# Patient Record
Sex: Male | Born: 1995 | Race: Black or African American | Hispanic: No | Marital: Single | Smoking: Never smoker
Health system: Southern US, Community
[De-identification: ages and names within clinical notes are randomized; demographics above are authoritative.]

## PROBLEM LIST (undated history)

## (undated) DIAGNOSIS — J45909 Unspecified asthma, uncomplicated: Secondary | ICD-10-CM

---
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
## 2013-04-03 IMAGING — CR DG KNEE COMPLETE 4+V*L*
4 series · 4 of 4 positions shown · non-contrast
Comparison: None.

CLINICAL DATA: Injury with pain

LEFT KNEE - COMPLETE 4+ VIEW

[view not recorded (1 of 4)]
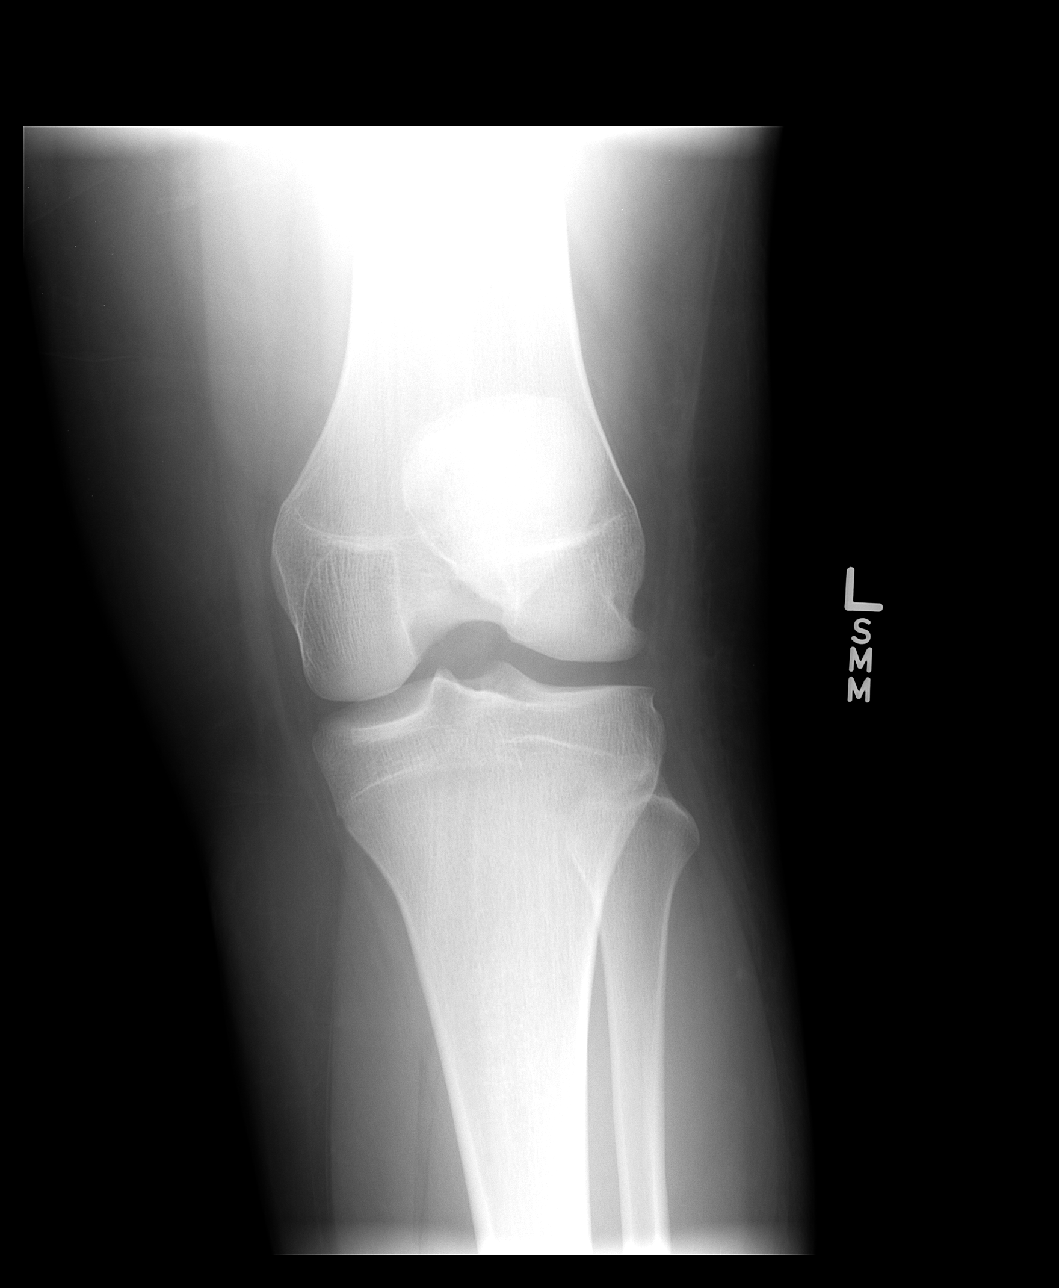

[view not recorded (2 of 4)]
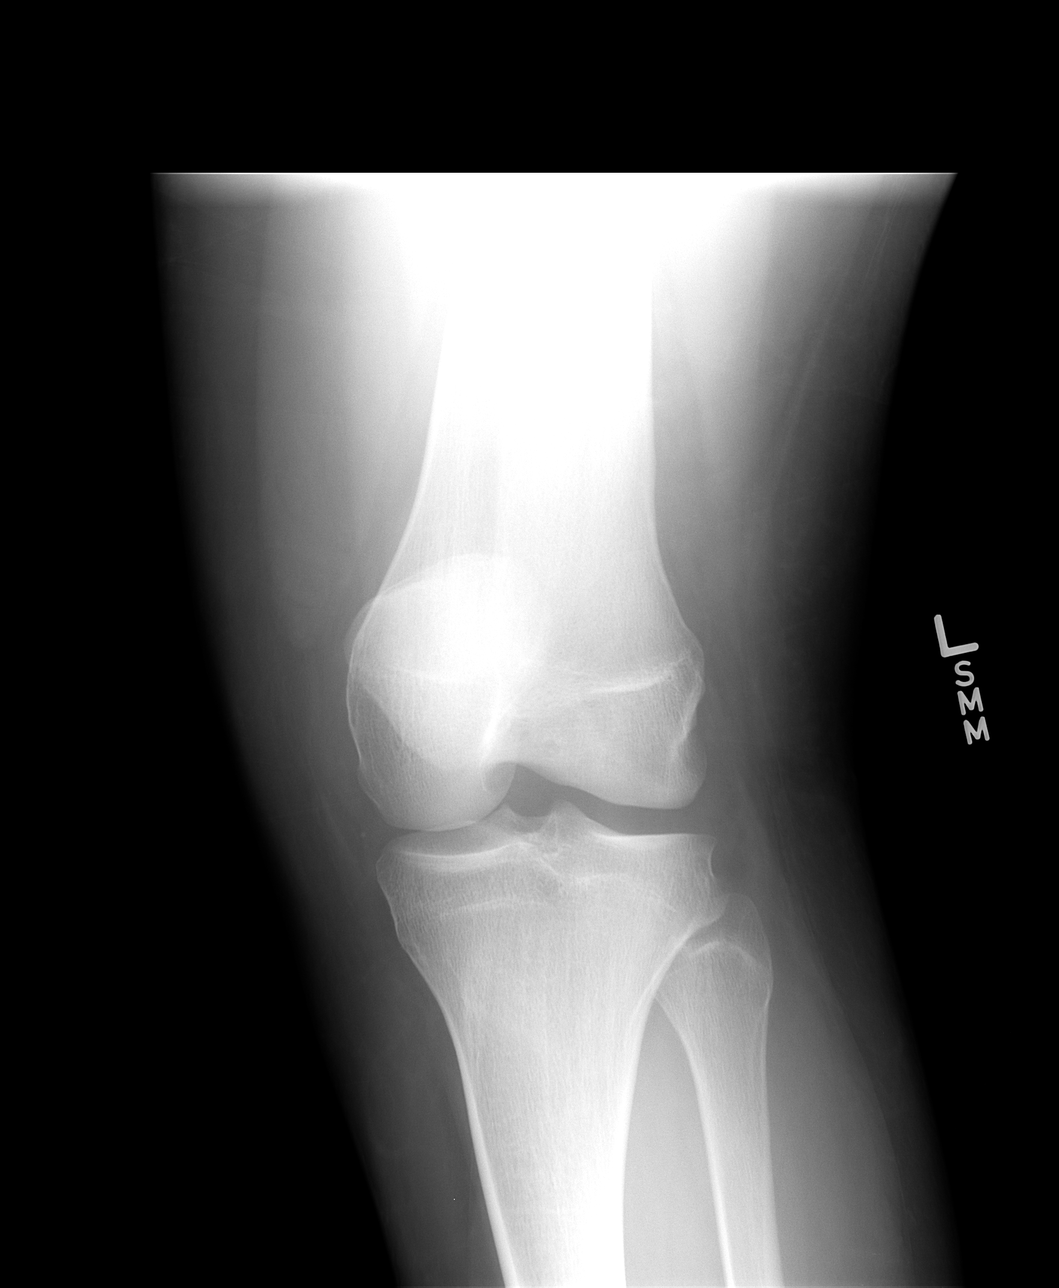

[view not recorded (3 of 4)]
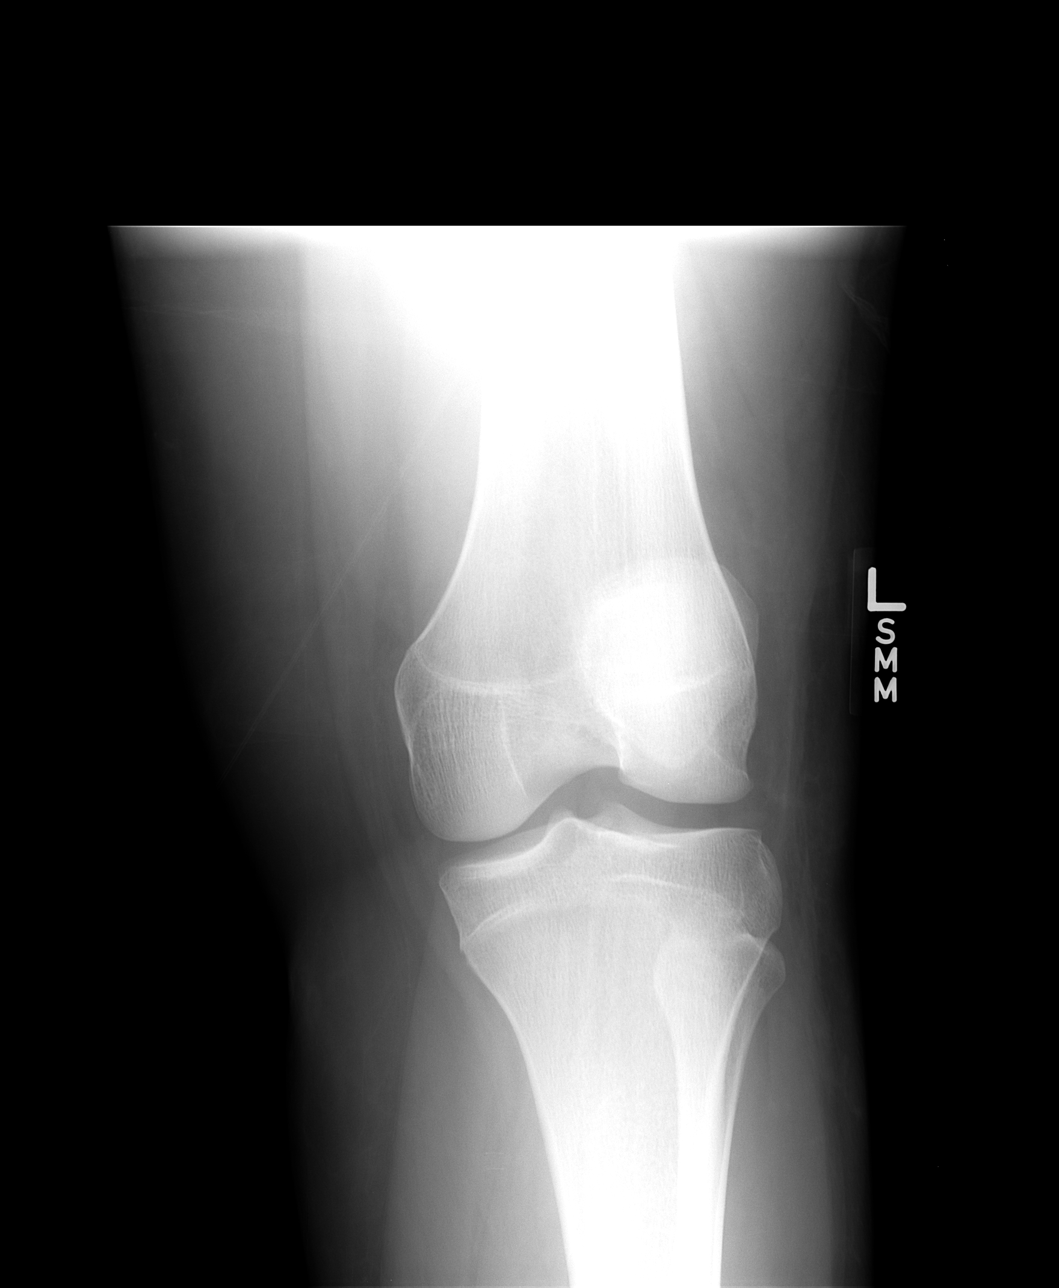

[view not recorded (4 of 4)]
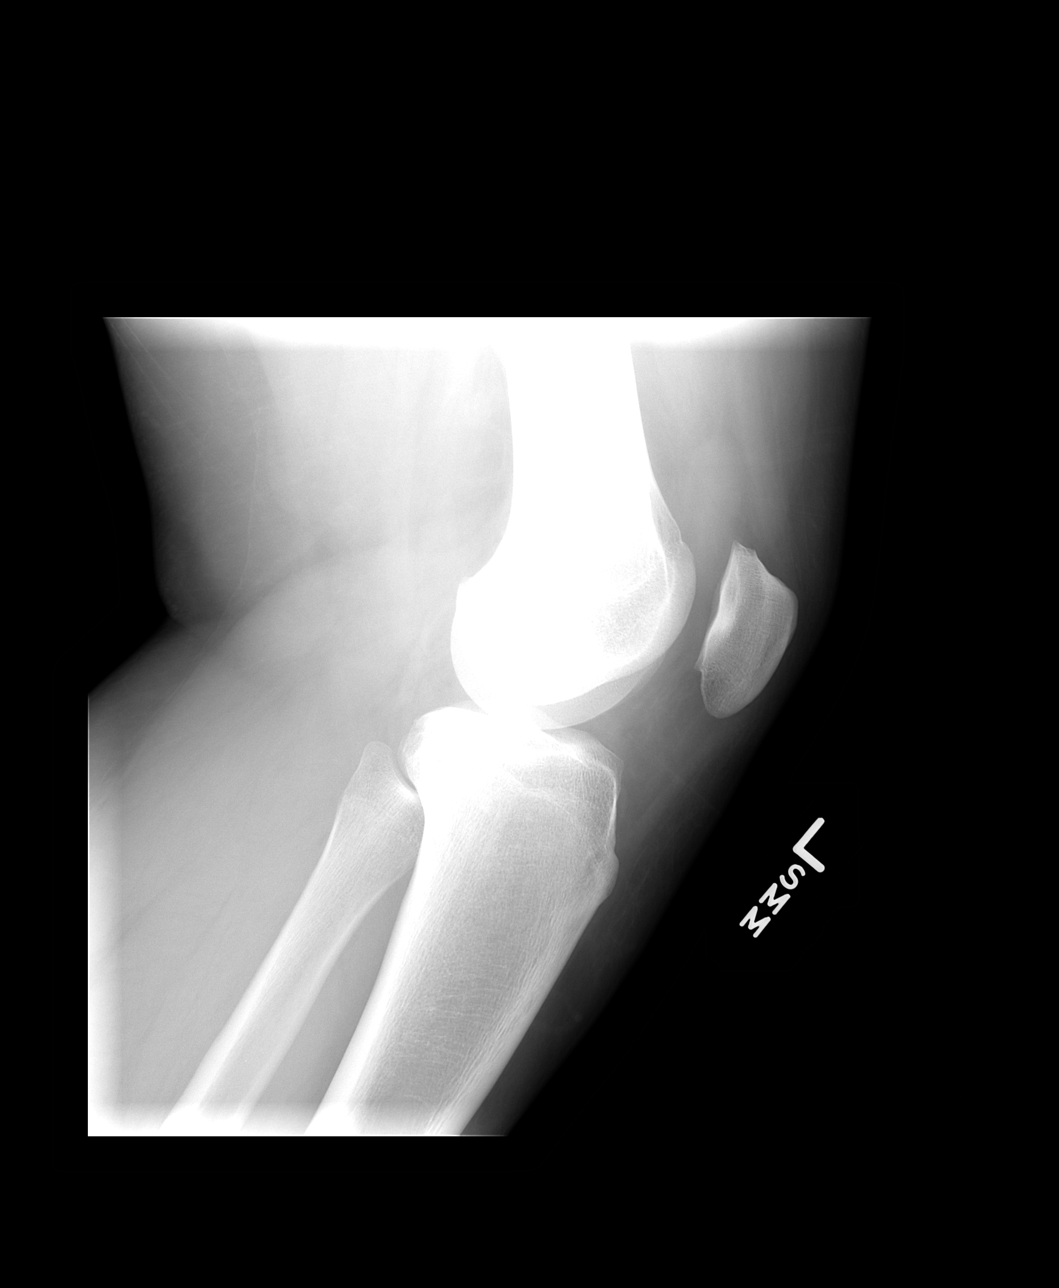

[4 of 4 positions shown; findings below may reference images not displayed]

FINDINGS: There is a moderate joint effusion.  No fracture,
degenerative change or other focal finding.
IMPRESSION: Moderate joint effusion.  No visible fracture.

## 2018-09-08 ENCOUNTER — Encounter (HOSPITAL_COMMUNITY): Payer: Self-pay

## 2018-09-08 ENCOUNTER — Ambulatory Visit (HOSPITAL_COMMUNITY)
Admission: EM | Admit: 2018-09-08 | Discharge: 2018-09-08 | Disposition: A | Discharge status: Home / Self Care | Payer: Self-pay | Attending: Family Medicine | Admitting: Family Medicine

## 2018-09-08 ENCOUNTER — Other Ambulatory Visit: Payer: Self-pay

## 2018-09-08 DIAGNOSIS — J4520 Mild intermittent asthma, uncomplicated: Secondary | ICD-10-CM

## 2018-09-08 DIAGNOSIS — Z029 Encounter for administrative examinations, unspecified: Secondary | ICD-10-CM

## 2018-09-08 NOTE — ED Provider Notes (Signed)
[facility]    CSN: [id number] Arrival date & time: 09/08/18  0350     History   Chief Complaint Chief Complaint  Patient presents with  . Asthma    HPI Brett Lindsey is a 23 y.o. male.   Pt is a 23 year old male with PMH of asthma that presents needing a work note. Reporting that he is worried due to work environment and the corona virus pandemic. He is concerned due to his asthma that he is more high risk. He is not currently having any cough, congestion, rhinorrhea, sore throat, ear pain, fevers, chills, night sweats.  No chest pain or shortness of breath.  Reports that he uses inhaler as needed typically before activity.  He has not been using it more than normal.  Denies any history of allergies. No recent traveling or sick contacts.  ROS per HPI    Asthma     History reviewed. No pertinent past medical history.  There are no active problems to display for this patient.   History reviewed. No pertinent surgical history.     Home Medications    Prior to Admission medications   Medication Sig Start Date End Date Taking? Authorizing Provider  albuterol (PROVENTIL HFA;VENTOLIN HFA) 108 (90 BASE) MCG/ACT inhaler Inhale 2 puffs into the lungs every 6 (six) hours as needed.    [provider]  HYDROcodone-acetaminophen (NORCO/VICODIN) 5-325 MG per tablet Take 1 tablet by mouth every 4 (four) hours as needed for pain. 04/09/12   Hayden Rasmussen, NP    Family History Family History  Problem Relation Age of Onset  . Asthma Mother   . Healthy Father     Social History Social History   Tobacco Use  . Smoking status: Never Smoker  . Smokeless tobacco: Never Used  Substance Use Topics  . Alcohol use: No  . Drug use: Not on file     Allergies   Patient has no known allergies.   Review of Systems Review of Systems   Physical Exam Triage Vital Signs ED Triage Vitals  Enc Vitals Group     BP 09/08/18 1020 (!) 143/103     Pulse  Rate 09/08/18 1020 98     Resp 09/08/18 1020 18     Temp 09/08/18 1020 98.5 F (36.9 C)     Temp Source 09/08/18 1020 Oral     SpO2 09/08/18 1020 100 %     Weight 09/08/18 1017 (!) 340 lb (154.2 kg)     Height --      Head Circumference --      Peak Flow --      Pain Score 09/08/18 1017 1     Pain Loc --      Pain Edu? --      Excl. in GC? --    No data found.  Updated Vital Signs BP (!) 143/103 (BP Location: Left Arm)   Pulse 98   Temp 98.5 F (36.9 C) (Oral)   Resp 18   Wt (!) 340 lb (154.2 kg)   SpO2 100%   Visual Acuity Right Eye Distance:   Left Eye Distance:   Bilateral Distance:    Right Eye Near:   Left Eye Near:    Bilateral Near:     Physical Exam Vitals signs and nursing note reviewed.  Constitutional:      General: He is not in acute distress.    Appearance: He is well-developed. He is obese. He is  not toxic-appearing or diaphoretic.  HENT:     Head: Normocephalic and atraumatic.     Nose: Nose normal.  Eyes:     Conjunctiva/sclera: Conjunctivae normal.  Neck:     Musculoskeletal: Neck supple.  Cardiovascular:     Rate and Rhythm: Normal rate and regular rhythm.     Heart sounds: No murmur.  Pulmonary:     Effort: Pulmonary effort is normal. No respiratory distress.     Breath sounds: Normal breath sounds.  Musculoskeletal: Normal range of motion.  Skin:    General: Skin is warm and dry.  Neurological:     Mental Status: He is alert.  Psychiatric:        Mood and Affect: Mood normal.      UC Treatments / Results  Labs (all labs ordered are listed, but only abnormal results are displayed) Labs Reviewed - No data to display  EKG None  Radiology No results found.  Procedures Procedures (including critical care time)  Medications Ordered in UC Medications - No data to display  Initial Impression / Assessment and Plan / UC Course  I have reviewed the triage vital signs and the nursing notes.  Pertinent labs & imaging results  that were available during my care of the patient were reviewed by me and considered in my medical decision making (see chart for details).     Pt appears well today Gave a note stating that he has asthma as requested.   Final Clinical Impressions(s) / UC Diagnoses   Final diagnoses:  None     Discharge Instructions     Work note given  Follow up as needed for continued or worsening symptoms     ED Prescriptions    None     Controlled Substance Prescriptions [place] Controlled Substance Registry consulted? Not Applicable   Janace Aris, NP 09/08/18 1057

## 2018-09-08 NOTE — ED Triage Notes (Signed)
Pt cc asthma is flaring up. Pt needs a note saying he has asthma and it is a issue for him at this time.

## 2018-09-08 NOTE — Discharge Instructions (Addendum)
Work note given  Follow up as needed for continued or worsening symptoms

## 2021-03-08 ENCOUNTER — Other Ambulatory Visit: Payer: Self-pay

## 2021-03-08 ENCOUNTER — Encounter (HOSPITAL_COMMUNITY): Payer: Self-pay | Admitting: *Deleted

## 2021-03-08 ENCOUNTER — Ambulatory Visit: Payer: Self-pay

## 2021-03-08 ENCOUNTER — Ambulatory Visit (HOSPITAL_COMMUNITY)
Admission: EM | Admit: 2021-03-08 | Discharge: 2021-03-08 | Disposition: A | Discharge status: Home / Self Care | Payer: Self-pay | Attending: Family Medicine | Admitting: Family Medicine

## 2021-03-08 DIAGNOSIS — J4541 Moderate persistent asthma with (acute) exacerbation: Secondary | ICD-10-CM

## 2021-03-08 MED ORDER — PREDNISONE 20 MG PO TABS: 40.0000 mg | ORAL_TABLET | Freq: Every day | ORAL | 0 refills | Status: DC

## 2021-03-08 MED ORDER — ALBUTEROL SULFATE HFA 108 (90 BASE) MCG/ACT IN AERS: 1.0000 | INHALATION_SPRAY | Freq: Four times a day (QID) | RESPIRATORY_TRACT | 1 refills | Status: AC | PRN

## 2021-03-08 NOTE — ED Provider Notes (Signed)
  [facility]   [id number] 03/08/21 Arrival Time: 1248  ASSESSMENT & PLAN:  1. Moderate persistent asthma with exacerbation    Begin: Meds ordered this encounter  Medications   predniSONE (DELTASONE) 20 MG tablet    Sig: Take 2 tablets (40 mg total) by mouth daily.    Dispense:  10 tablet    Refill:  0   albuterol (VENTOLIN HFA) 108 (90 Base) MCG/ACT inhaler    Sig: Inhale 1-2 puffs into the lungs every 6 (six) hours as needed for wheezing or shortness of breath.    Dispense:  1 each    Refill:  1   Asthma precautions given. OTC symptom care as needed.  Recommend:  Follow-up Information     [facility].   Specialty: Urgent Care Why: If worsening or failing to improve as anticipated. Contact information: [address] [place] [address] [phone]               Work note provided. Reviewed expectations re: course of current medical issues. Questions answered. Outlined signs and symptoms indicating need for more acute intervention. Patient verbalized understanding. After Visit Summary given.  SUBJECTIVE: History from: patient. Brett Lindsey is a 25 y.o. male who presents with complaint of intermittent wheezing. Onset gradual,  over past month after moving into new apartment . Fever: none. Overall normal PO intake without n/v. Sick contacts: no. Ambulatory without difficulty. No LE edema. Typically his asthma is well controlled. Inhaler use: none. OTC treatment: none. No assoc CP. Also noted lower lip bruising yesterday. No bleeding from mouth. No skin bruising.  Social History   Tobacco Use  Smoking Status Never  Smokeless Tobacco Never    OBJECTIVE:  Vitals:   03/08/21 1408  BP: (!) 153/81  Pulse: (!) 108  Resp: 20  Temp: 99.1 F (37.3 C)  SpO2: 96%    Slight tachycardia noted. General appearance: alert; NAD HEENT: [place]; AT; without nasal congestion; lower lip is slightly bruised; no  other oral bruising noted Neck: supple without LAD Cv: RRR without murmer Lungs: unlabored respirations,  marked  bilateral inspiratory and expiratory wheezing; cough: mild; no significant respiratory distress Skin: warm and dry Psychological: alert and cooperative; normal mood and affect   No Known Allergies  History reviewed. No pertinent past medical history. Family History  Problem Relation Age of Onset   Asthma Mother    Healthy Father    Social History   Socioeconomic History   Marital status: Single    Spouse name: Not on file   Number of children: Not on file   Years of education: Not on file   Highest education level: Not on file  Occupational History   Not on file  Tobacco Use   Smoking status: Never   Smokeless tobacco: Never  Substance and Sexual Activity   Alcohol use: No   Drug use: Not on file   Sexual activity: Not on file  Other Topics Concern   Not on file  Social History Narrative   Not on file   Social Determinants of Health   Financial Resource Strain: Not on file  Food Insecurity: Not on file  Transportation Needs: Not on file  Physical Activity: Not on file  Stress: Not on file  Social Connections: Not on file  Intimate Partner Violence: Not on file             Mardella Layman, MD 03/08/21 1426

## 2021-03-08 NOTE — ED Triage Notes (Signed)
Pt reports Sx's for one month.  Pt wheezing and reports lips are bruised .

## 2023-05-30 ENCOUNTER — Encounter (HOSPITAL_COMMUNITY): Payer: Self-pay

## 2023-05-30 ENCOUNTER — Emergency Department (HOSPITAL_COMMUNITY): Payer: Self-pay

## 2023-05-30 ENCOUNTER — Emergency Department (HOSPITAL_COMMUNITY)
Admission: EM | Admit: 2023-05-30 | Discharge: 2023-05-30 | Disposition: A | Discharge status: Home / Self Care | Payer: Self-pay | Attending: Emergency Medicine | Admitting: Emergency Medicine

## 2023-05-30 ENCOUNTER — Other Ambulatory Visit: Payer: Self-pay

## 2023-05-30 DIAGNOSIS — N201 Calculus of ureter: Secondary | ICD-10-CM | POA: Insufficient documentation

## 2023-05-30 DIAGNOSIS — J45909 Unspecified asthma, uncomplicated: Secondary | ICD-10-CM | POA: Insufficient documentation

## 2023-05-30 DIAGNOSIS — N139 Obstructive and reflux uropathy, unspecified: Secondary | ICD-10-CM | POA: Insufficient documentation

## 2023-05-30 HISTORY — DX: Unspecified asthma, uncomplicated: J45.909

## 2023-05-30 LAB — CBC
HCT: 39.2 % (ref 39.0–52.0)
Hemoglobin: 13.2 g/dL (ref 13.0–17.0)
MCH: 29.5 pg (ref 26.0–34.0)
MCHC: 33.7 g/dL (ref 30.0–36.0)
MCV: 87.5 fL (ref 80.0–100.0)
Platelets: 272 10*3/uL (ref 150–400)
RBC: 4.48 MIL/uL (ref 4.22–5.81)
RDW: 11.6 % (ref 11.5–15.5)
WBC: 12.8 10*3/uL — ABNORMAL HIGH (ref 4.0–10.5)
nRBC: 0 % (ref 0.0–0.2)

## 2023-05-30 LAB — COMPREHENSIVE METABOLIC PANEL
ALT: 27 U/L (ref 0–44)
AST: 23 U/L (ref 15–41)
Albumin: 3.5 g/dL (ref 3.5–5.0)
Alkaline Phosphatase: 47 U/L (ref 38–126)
Anion gap: 7 (ref 5–15)
BUN: 11 mg/dL (ref 6–20)
CO2: 21 mmol/L — ABNORMAL LOW (ref 22–32)
Calcium: 8.8 mg/dL — ABNORMAL LOW (ref 8.9–10.3)
Chloride: 109 mmol/L (ref 98–111)
Creatinine, Ser: 1.01 mg/dL (ref 0.61–1.24)
GFR, Estimated: >60 mL/min (ref >60)
Glucose, Bld: 115 mg/dL — ABNORMAL HIGH (ref 70–99)
Potassium: 3.7 mmol/L (ref 3.5–5.1)
Sodium: 137 mmol/L (ref 135–145)
Total Bilirubin: 0.6 mg/dL (ref <1.2)
Total Protein: 8 g/dL (ref 6.5–8.1)

## 2023-05-30 LAB — URINALYSIS, ROUTINE W REFLEX MICROSCOPIC
Bilirubin Urine: NEGATIVE
Glucose, UA: NEGATIVE mg/dL
Hgb urine dipstick: NEGATIVE
Ketones, ur: NEGATIVE mg/dL
Leukocytes,Ua: NEGATIVE
Nitrite: NEGATIVE
Protein, ur: NEGATIVE mg/dL
Specific Gravity, Urine: >1.046 — ABNORMAL HIGH (ref 1.005–1.030)
pH: 5 (ref 5.0–8.0)

## 2023-05-30 LAB — LIPASE, BLOOD: Lipase: 26 U/L (ref 11–51)

## 2023-05-30 MED ORDER — FENTANYL CITRATE PF 50 MCG/ML IJ SOSY: 50.0000 ug | PREFILLED_SYRINGE | Freq: Once | INTRAMUSCULAR | Status: DC

## 2023-05-30 MED ORDER — MORPHINE SULFATE (PF) 4 MG/ML IV SOLN
4.0000 mg | Freq: Once | INTRAVENOUS | Status: AC
  Administered 2023-05-30: 4 mg via INTRAVENOUS
  Filled 2023-05-30: qty 1

## 2023-05-30 MED ORDER — ONDANSETRON 4 MG PO TBDP
4.0000 mg | ORAL_TABLET | Freq: Once | ORAL | Status: AC
  Administered 2023-05-30: 4 mg via ORAL
  Filled 2023-05-30: qty 1

## 2023-05-30 MED ORDER — PIPERACILLIN-TAZOBACTAM 3.375 G IVPB 30 MIN: 3.3750 g | Freq: Once | INTRAVENOUS | Status: DC

## 2023-05-30 MED ORDER — OXYCODONE-ACETAMINOPHEN 5-325 MG PO TABS
1.0000 | ORAL_TABLET | Freq: Once | ORAL | Status: AC
  Administered 2023-05-30: 1 via ORAL
  Filled 2023-05-30: qty 1

## 2023-05-30 MED ORDER — OXYCODONE-ACETAMINOPHEN 5-325 MG PO TABS
2.0000 | ORAL_TABLET | Freq: Once | ORAL | Status: AC
  Administered 2023-05-30: 2 via ORAL
  Filled 2023-05-30: qty 2

## 2023-05-30 MED ORDER — ONDANSETRON HCL 4 MG/2ML IJ SOLN
4.0000 mg | Freq: Once | INTRAMUSCULAR | Status: AC
  Administered 2023-05-30: 4 mg via INTRAVENOUS
  Filled 2023-05-30: qty 2

## 2023-05-30 MED ORDER — IOHEXOL 350 MG/ML SOLN
75.0000 mL | Freq: Once | INTRAVENOUS | Status: AC | PRN
  Administered 2023-05-30: 75 mL via INTRAVENOUS

## 2023-05-30 MED ORDER — ONDANSETRON 4 MG PO TBDP: 4.0000 mg | ORAL_TABLET | Freq: Three times a day (TID) | ORAL | 0 refills | Status: AC | PRN

## 2023-05-30 MED ORDER — OXYCODONE-ACETAMINOPHEN 5-325 MG PO TABS: 1.0000 | ORAL_TABLET | Freq: Four times a day (QID) | ORAL | 0 refills | Status: AC | PRN

## 2023-05-30 MED ORDER — KETOROLAC TROMETHAMINE 15 MG/ML IJ SOLN
15.0000 mg | Freq: Once | INTRAMUSCULAR | Status: AC
  Administered 2023-05-30: 15 mg via INTRAVENOUS
  Filled 2023-05-30: qty 1

## 2023-05-30 MED ORDER — SODIUM CHLORIDE 0.9 % IV BOLUS
1000.0000 mL | Freq: Once | INTRAVENOUS | Status: AC
  Administered 2023-05-30: 1000 mL via INTRAVENOUS

## 2023-05-30 MED ORDER — IBUPROFEN 800 MG PO TABS: 800.0000 mg | ORAL_TABLET | Freq: Three times a day (TID) | ORAL | 0 refills | Status: AC | PRN

## 2023-05-30 NOTE — ED Triage Notes (Signed)
RLQ abd pain that started at 0700 this morning with nausea vomoiting.  Patient reports only in lower quad.  BM today was normal.  Denies urinary symptoms.

## 2023-05-30 NOTE — Discharge Instructions (Addendum)
Please follow-up with urology as instructed, return to the ER if you have worsening pain, intractable nausea, vomiting, or inability to urinate after after 6 hours.  Please strain your urine, to catch your stone.

## 2023-05-30 NOTE — ED Provider Notes (Signed)
[place] EMERGENCY DEPARTMENT AT [facility] Provider Note   CSN: [id number] Arrival date & time: 05/30/23  1134     History  Chief Complaint  Patient presents with   Abdominal Pain    Brett Lindsey is a 27 y.o. male, history of asthma, who presents to the ED secondary to right lower quadrant pain, that started this a.m.  He states around 7:00, he started having some right abdominal pain, that went to the left side of his belly.  Has been more on the right side of his lower abdomen, however states the pain has been increasing in nature.  Notes that he has vomited 5 times, and denies any fevers.  Pain is worse with when he moves, or bumps into something he states.  Has never had any intra-abdominal surgeries before.  No urinary symptoms, penile discharge, upper respiratory symptoms.     Home Medications Prior to Admission medications   Medication Sig Start Date End Date Taking? Authorizing Provider  albuterol (VENTOLIN HFA) 108 (90 Base) MCG/ACT inhaler Inhale 1-2 puffs into the lungs every 6 (six) hours as needed for wheezing or shortness of breath. 03/08/21  Yes Mardella Layman, MD  ibuprofen (ADVIL) 800 MG tablet Take 1 tablet (800 mg total) by mouth every 8 (eight) hours as needed for moderate pain (pain score 4-6). 05/30/23  Yes Xzavior Reinig L, PA  ondansetron (ZOFRAN-ODT) 4 MG disintegrating tablet Take 1 tablet (4 mg total) by mouth every 8 (eight) hours as needed. 05/30/23  Yes Nyleah Mcginnis L, PA  oxyCODONE-acetaminophen (PERCOCET/ROXICET) 5-325 MG tablet Take 1 tablet by mouth every 6 (six) hours as needed for severe pain (pain score 7-10). 05/30/23  Yes Taryll Reichenberger L, PA      Allergies    Patient has no known allergies.    Review of Systems   Review of Systems  Constitutional:  Negative for fever.  Gastrointestinal:  Positive for abdominal pain, nausea and vomiting.    Physical Exam Updated Vital Signs BP (!) 156/74 (BP Location: Right Arm)   Pulse  83   Temp 99.1 F (37.3 C) (Oral)   Resp 18   Ht 6\' 3"  (1.905 m)   Wt (!) 154.2 kg   SpO2 100%   BMI 42.50 kg/m  Physical Exam Vitals and nursing note reviewed.  Constitutional:      General: He is not in acute distress.    Appearance: He is well-developed.  HENT:     Head: Normocephalic and atraumatic.  Eyes:     Conjunctiva/sclera: Conjunctivae normal.  Cardiovascular:     Rate and Rhythm: Normal rate and regular rhythm.     Heart sounds: No murmur heard. Pulmonary:     Effort: Pulmonary effort is normal. No respiratory distress.     Breath sounds: Normal breath sounds.  Abdominal:     Palpations: Abdomen is soft.     Tenderness: There is abdominal tenderness in the right lower quadrant and left lower quadrant. There is guarding and rebound.  Musculoskeletal:        General: No swelling.     Cervical back: Neck supple.  Skin:    General: Skin is warm and dry.     Capillary Refill: Capillary refill takes less than 2 seconds.  Neurological:     Mental Status: He is alert.  Psychiatric:        Mood and Affect: Mood normal.     ED Results / Procedures / Treatments   Labs (all  labs ordered are listed, but only abnormal results are displayed) Labs Reviewed  COMPREHENSIVE METABOLIC PANEL - Abnormal; Notable for the following components:      Result Value   CO2 21 (*)    Glucose, Bld 115 (*)    Calcium 8.8 (*)    All other components within normal limits  CBC - Abnormal; Notable for the following components:   WBC 12.8 (*)    All other components within normal limits  URINALYSIS, ROUTINE W REFLEX MICROSCOPIC - Abnormal; Notable for the following components:   Specific Gravity, Urine >1.046 (*)    All other components within normal limits  LIPASE, BLOOD    EKG None  Radiology CT ABDOMEN PELVIS W CONTRAST Result Date: 05/30/2023 CLINICAL DATA:  Right lower quadrant abdominal pain. EXAM: CT ABDOMEN AND PELVIS WITH CONTRAST TECHNIQUE: Multidetector CT imaging of  the abdomen and pelvis was performed using the standard protocol following bolus administration of intravenous contrast. RADIATION DOSE REDUCTION: This exam was performed according to the departmental dose-optimization program which includes automated exposure control, adjustment of the mA and/or kV according to patient size and/or use of iterative reconstruction technique. CONTRAST:  75mL OMNIPAQUE IOHEXOL 350 MG/ML SOLN COMPARISON:  None Available. FINDINGS: Lower chest: No acute abnormality. Hepatobiliary: No focal liver abnormality is seen. No gallstones, gallbladder wall thickening, or biliary dilatation. Pancreas: Unremarkable. No pancreatic ductal dilatation or surrounding inflammatory changes. Spleen: Normal in size without focal abnormality. Adrenals/Urinary Tract: The adrenal glands are unremarkable. 3 mm calculus in the distal right ureter. Borderline mild right hydroureteronephrosis. There are two additional punctate calculi in the anterior midpole of the right kidney. Delayed excretion of contrast from the right kidney compared to the left. No renal mass. Bladder is unremarkable. Stomach/Bowel: Stomach is within normal limits. Appendix appears normal. No evidence of bowel wall thickening, distention, or inflammatory changes. Vascular/Lymphatic: No significant vascular findings are present. No enlarged abdominal or pelvic lymph nodes. Reproductive: Prostate is unremarkable. Other: No abdominal wall hernia or abnormality. No abdominopelvic ascites. Musculoskeletal: No acute or significant osseous findings. IMPRESSION: 1. 3 mm distal right ureteral calculus with mild obstructive uropathy. 2. Additional punctate right nephrolithiasis. Electronically Signed   By: Obie Dredge M.D.   On: 05/30/2023 21:02    Procedures Procedures    Medications Ordered in ED Medications  oxyCODONE-acetaminophen (PERCOCET/ROXICET) 5-325 MG per tablet 2 tablet (2 tablets Oral Given 05/30/23 1300)  ondansetron  (ZOFRAN-ODT) disintegrating tablet 4 mg (4 mg Oral Given 05/30/23 1301)  ketorolac (TORADOL) 15 MG/ML injection 15 mg (15 mg Intravenous Given 05/30/23 1711)  sodium chloride 0.9 % bolus 1,000 mL (0 mLs Intravenous Stopped 05/30/23 1930)  ondansetron (ZOFRAN) injection 4 mg (4 mg Intravenous Given 05/30/23 1711)  iohexol (OMNIPAQUE) 350 MG/ML injection 75 mL (75 mLs Intravenous Contrast Given 05/30/23 1817)  morphine (PF) 4 MG/ML injection 4 mg (4 mg Intravenous Given 05/30/23 1931)  oxyCODONE-acetaminophen (PERCOCET/ROXICET) 5-325 MG per tablet 1 tablet (1 tablet Oral Given 05/30/23 2146)    ED Course/ Medical Decision Making/ A&P                                 Medical Decision Making Patient is a 27 year old male, here for right lower quadrant abdominal pain, that started today, pain has been constant, and is sharp and stabbing.  Has associated nausea, vomiting, but no urinary symptoms or diarrhea.  Has tenderness to palpation right lower quadrant, with guarding.  We obtain urinalysis, CT scan, for further evaluation.  Toradol for pain control  Amount and/or Complexity of Data Reviewed Labs: ordered.    Details: Unremarkable except for mildly elevated white count of 12.8 K, urine with no bacteria Radiology: ordered.    Details: CT abdomen pelvis, shows 3 mm distal right ureteral calculus with mild obstructive uropathy, and additional punctate right nephrolithiasis Discussion of management or test interpretation with external provider(s): Patient was found to have a right ureteral calculus, with a little bit of mild obstructive uropathy.  He is still able to empty his bladder, and is has his pain under control.  I sent him Percocets, Zofran, and a urine strainer, as well as some ibuprofen to the pharmacy.  We discussed return precautions follow-up with urology, and he voiced understanding.  He was able to tolerate a p.o. trial  Risk Prescription drug management.    Final Clinical  Impression(s) / ED Diagnoses Final diagnoses:  Right ureteral stone  Obstructive uropathy    Rx / DC Orders ED Discharge Orders          Ordered    oxyCODONE-acetaminophen (PERCOCET/ROXICET) 5-325 MG tablet  Every 6 hours PRN        05/30/23 2131    ondansetron (ZOFRAN-ODT) 4 MG disintegrating tablet  Every 8 hours PRN        05/30/23 2131    ibuprofen (ADVIL) 800 MG tablet  Every 8 hours PRN        05/30/23 2131              Azzure Garabedian L, PA 05/30/23 2156    Anders Simmonds T, DO 05/31/23 1718
